# Patient Record
Sex: Male | Born: 1980 | Race: White | Hispanic: No | Marital: Married
Health system: Southern US, Community
[De-identification: ages and names within clinical notes are randomized; demographics above are authoritative.]

---

## 2015-11-22 ENCOUNTER — Emergency Department (HOSPITAL_COMMUNITY): Payer: Managed Care, Other (non HMO)

## 2015-11-22 ENCOUNTER — Encounter (HOSPITAL_COMMUNITY): Payer: Self-pay | Admitting: Radiology

## 2015-11-22 ENCOUNTER — Emergency Department (HOSPITAL_COMMUNITY)
Admission: EM | Admit: 2015-11-22 | Discharge: 2015-11-23 | Disposition: A | Discharge status: Home / Self Care | Payer: Managed Care, Other (non HMO) | Attending: Emergency Medicine | Admitting: Emergency Medicine

## 2015-11-22 DIAGNOSIS — M25552 Pain in left hip: Secondary | ICD-10-CM | POA: Insufficient documentation

## 2015-11-22 DIAGNOSIS — M21372 Foot drop, left foot: Secondary | ICD-10-CM

## 2015-11-22 DIAGNOSIS — S79922A Unspecified injury of left thigh, initial encounter: Secondary | ICD-10-CM | POA: Insufficient documentation

## 2015-11-22 DIAGNOSIS — R918 Other nonspecific abnormal finding of lung field: Secondary | ICD-10-CM | POA: Insufficient documentation

## 2015-11-22 DIAGNOSIS — R930 Abnormal findings on diagnostic imaging of skull and head, not elsewhere classified: Secondary | ICD-10-CM | POA: Diagnosis not present

## 2015-11-22 DIAGNOSIS — R791 Abnormal coagulation profile: Secondary | ICD-10-CM | POA: Insufficient documentation

## 2015-11-22 DIAGNOSIS — Y939 Activity, unspecified: Secondary | ICD-10-CM | POA: Diagnosis not present

## 2015-11-22 DIAGNOSIS — R52 Pain, unspecified: Secondary | ICD-10-CM

## 2015-11-22 DIAGNOSIS — T1490XA Injury, unspecified, initial encounter: Secondary | ICD-10-CM

## 2015-11-22 DIAGNOSIS — Y92410 Unspecified street and highway as the place of occurrence of the external cause: Secondary | ICD-10-CM | POA: Diagnosis not present

## 2015-11-22 DIAGNOSIS — T07XXXA Unspecified multiple injuries, initial encounter: Secondary | ICD-10-CM

## 2015-11-22 DIAGNOSIS — Y999 Unspecified external cause status: Secondary | ICD-10-CM | POA: Diagnosis not present

## 2015-11-22 LAB — COMPREHENSIVE METABOLIC PANEL
ALK PHOS: 50 U/L (ref 38–126)
ALT: 29 U/L (ref 17–63)
AST: 26 U/L (ref 15–41)
Albumin: 4.4 g/dL (ref 3.5–5.0)
Anion gap: 9 (ref 5–15)
BUN: 14 mg/dL (ref 6–20)
CALCIUM: 8.9 mg/dL (ref 8.9–10.3)
CHLORIDE: 107 mmol/L (ref 101–111)
CO2: 22 mmol/L (ref 22–32)
CREATININE: 1.33 mg/dL — AB (ref 0.61–1.24)
Glucose, Bld: 97 mg/dL (ref 65–99)
Potassium: 3.8 mmol/L (ref 3.5–5.1)
Sodium: 138 mmol/L (ref 135–145)
Total Bilirubin: 0.5 mg/dL (ref 0.3–1.2)
Total Protein: 6.6 g/dL (ref 6.5–8.1)

## 2015-11-22 LAB — I-STAT CHEM 8, ED
BUN: 16 mg/dL (ref 6–20)
CHLORIDE: 106 mmol/L (ref 101–111)
CREATININE: 1.5 mg/dL — AB (ref 0.61–1.24)
Calcium, Ion: 1.08 mmol/L — ABNORMAL LOW (ref 1.15–1.40)
GLUCOSE: 92 mg/dL (ref 65–99)
HEMATOCRIT: 43 % (ref 39.0–52.0)
Hemoglobin: 14.6 g/dL (ref 13.0–17.0)
POTASSIUM: 3.6 mmol/L (ref 3.5–5.1)
Sodium: 140 mmol/L (ref 135–145)
TCO2: 23 mmol/L (ref 0–100)

## 2015-11-22 LAB — PROTIME-INR
INR: 1.03
Prothrombin Time: 13.6 seconds (ref 11.4–15.2)

## 2015-11-22 LAB — SAMPLE TO BLOOD BANK

## 2015-11-22 LAB — CBC
HCT: 43.3 % (ref 39.0–52.0)
Hemoglobin: 15 g/dL (ref 13.0–17.0)
MCH: 29.8 pg (ref 26.0–34.0)
MCHC: 34.6 g/dL (ref 30.0–36.0)
MCV: 86.1 fL (ref 78.0–100.0)
PLATELETS: 340 10*3/uL (ref 150–400)
RBC: 5.03 MIL/uL (ref 4.22–5.81)
RDW: 12.1 % (ref 11.5–15.5)
WBC: 10.9 10*3/uL — AB (ref 4.0–10.5)

## 2015-11-22 LAB — I-STAT CG4 LACTIC ACID, ED: Lactic Acid, Venous: 1.11 mmol/L (ref 0.5–1.9)

## 2015-11-22 LAB — CDS SEROLOGY

## 2015-11-22 LAB — ETHANOL

## 2015-11-22 MED ORDER — FENTANYL CITRATE (PF) 100 MCG/2ML IJ SOLN
50.0000 ug | Freq: Once | INTRAMUSCULAR | Status: AC
  Administered 2015-11-22: 50 ug via INTRAVENOUS

## 2015-11-22 MED ORDER — IOPAMIDOL (ISOVUE-300) INJECTION 61%
100.0000 mL | Freq: Once | INTRAVENOUS | Status: AC | PRN
  Administered 2015-11-22: 100 mL via INTRAVENOUS

## 2015-11-22 MED ORDER — SODIUM CHLORIDE 0.9 % IV BOLUS (SEPSIS)
500.0000 mL | Freq: Once | INTRAVENOUS | Status: AC
  Administered 2015-11-22: 500 mL via INTRAVENOUS

## 2015-11-22 MED ORDER — FENTANYL CITRATE (PF) 100 MCG/2ML IJ SOLN
INTRAMUSCULAR | Status: AC
  Administered 2015-11-22: 50 ug via INTRAVENOUS
  Filled 2015-11-22: qty 2

## 2015-11-22 MED ORDER — SODIUM CHLORIDE 0.9 % IV BOLUS (SEPSIS)
1000.0000 mL | Freq: Once | INTRAVENOUS | Status: AC
  Administered 2015-11-22: 1000 mL via INTRAVENOUS

## 2015-11-22 NOTE — ED Notes (Signed)
Family updated as to patient's status.

## 2015-11-22 NOTE — ED Notes (Signed)
Transported to [facility]
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
## 2015-11-22 NOTE — Progress Notes (Signed)
   Chaplain responded to page  Facilitated family gathering w/ patient.   Will follow, as needed.  Rev. Chaplain Kipp Broodnthony Zalika Tieszen MDiv ThM

## 2015-11-22 NOTE — ED Notes (Signed)
Patient transported to CT 

## 2015-11-23 ENCOUNTER — Emergency Department (HOSPITAL_COMMUNITY): Payer: Managed Care, Other (non HMO)

## 2015-11-23 DIAGNOSIS — S79922A Unspecified injury of left thigh, initial encounter: Secondary | ICD-10-CM | POA: Diagnosis not present

## 2015-11-23 LAB — URINALYSIS, ROUTINE W REFLEX MICROSCOPIC
BILIRUBIN URINE: NEGATIVE
GLUCOSE, UA: NEGATIVE mg/dL
Hgb urine dipstick: NEGATIVE
KETONES UR: NEGATIVE mg/dL
LEUKOCYTES UA: NEGATIVE
Nitrite: NEGATIVE
PH: 6 (ref 5.0–8.0)
PROTEIN: NEGATIVE mg/dL
Specific Gravity, Urine: 1.01 (ref 1.005–1.030)

## 2015-11-23 MED ORDER — LORAZEPAM 2 MG/ML IJ SOLN
1.0000 mg | Freq: Once | INTRAMUSCULAR | Status: AC
  Administered 2015-11-23: 1 mg via INTRAVENOUS
  Filled 2015-11-23: qty 1

## 2015-11-23 MED ORDER — OXYCODONE-ACETAMINOPHEN 5-325 MG PO TABS
2.0000 | ORAL_TABLET | Freq: Once | ORAL | Status: AC
  Administered 2015-11-23: 2 via ORAL
  Filled 2015-11-23: qty 2

## 2015-11-23 MED ORDER — LORAZEPAM 2 MG/ML IJ SOLN: 2.0000 mg | Freq: Once | INTRAMUSCULAR | Status: DC

## 2015-11-23 MED ORDER — OXYCODONE-ACETAMINOPHEN 5-325 MG PO TABS: 1.0000 | ORAL_TABLET | Freq: Four times a day (QID) | ORAL | 0 refills | Status: AC | PRN

## 2015-11-23 NOTE — ED Notes (Signed)
Abrasions to left forearm, left upper arm, left knee, left knuckles cleaned and Neosporin applied
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
## 2015-11-23 NOTE — ED Notes (Signed)
Patient unable to continue MRI - requesting something for anxiety
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
## 2015-11-23 NOTE — Progress Notes (Signed)
Orthopedic Tech Progress Note Patient Details:  Vonzella NippleDavid Vandergrift 1980/07/28 [id number]  Ortho Devices Type of Ortho Device: CAM walker Ortho Device/Splint Location: lle Ortho Device/Splint Interventions: Ordered, Application   Trinna PostMartinez, Deryn Massengale J 11/23/2015, 3:52 AM

## 2015-12-02 ENCOUNTER — Ambulatory Visit: Payer: Managed Care, Other (non HMO) | Attending: Orthopedic Surgery | Admitting: Physical Therapy

## 2015-12-02 DIAGNOSIS — M546 Pain in thoracic spine: Secondary | ICD-10-CM | POA: Insufficient documentation

## 2015-12-02 DIAGNOSIS — M25552 Pain in left hip: Secondary | ICD-10-CM | POA: Diagnosis present

## 2015-12-02 DIAGNOSIS — M25562 Pain in left knee: Secondary | ICD-10-CM | POA: Diagnosis not present
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
## 2015-12-02 NOTE — Patient Instructions (Signed)
Hamstring Step 2    Left foot relaxed, knee straight, other leg bent, foot flat. Raise straight leg further upward to maximal range. Hold _30__ seconds. Relax leg completely down. Repeat _2-3__ times.  Repeat with other leg.  Perform 2-3 sessions per day.  Copyright  VHI. All rights reserved.    Piriformis Stretch    Lying on back, pull right knee toward opposite shoulder. Hold __30__ seconds. Repeat __2-3__ times. Do __2-3__ sessions per day.  Repeat with other leg.  http://gt2.exer.us/257   Copyright  VHI. All rights reserved.   Lower Trunk Rotation Stretch    Keeping back flat and feet together, rotate knees to left side. Hold __30__ seconds.  Repeat to other side. Repeat __2-3__ times per set. Do _1___ sets per session. Do ___2-3_ sessions per day.  http://orth.exer.us/122   Copyright  VHI. All rights reserved.

## 2015-12-04 ENCOUNTER — Ambulatory Visit: Payer: Managed Care, Other (non HMO)

## 2015-12-04 DIAGNOSIS — M25562 Pain in left knee: Secondary | ICD-10-CM

## 2015-12-04 DIAGNOSIS — M25552 Pain in left hip: Secondary | ICD-10-CM

## 2015-12-04 DIAGNOSIS — M546 Pain in thoracic spine: Secondary | ICD-10-CM

## 2015-12-04 NOTE — Therapy (Addendum)
[facility] [place] [address]  [address] [place], [place], [address] Phone: [phone]   Fax:  [phone]  Physical Therapy Treatment  Patient Details  Name: Charles Hogan MRN: [id number] Date of Birth: Aug 31, 1980 Referring Provider: Dr. Samson FredericBrian Swinteck  Encounter Date: 12/04/2015      PT End of Session - 12/04/15 1114    Visit Number 2   Number of Visits 8   Date for PT Re-Evaluation 12/30/15   PT Start Time 1102   PT Stop Time 1145   PT Time Calculation (min) 43 min   Activity Tolerance Patient tolerated treatment well   Behavior During Therapy [facility] for tasks assessed/performed      No past medical history on file.  No past surgical history on file.  There were no vitals filed for this visit.      Subjective Assessment - 12/04/15 1113    Subjective Pt. reporting performing HEP daily without issue.     Patient Stated Goals decrease knee pain   Currently in Pain? Yes   Pain Score 0-No pain   Multiple Pain Sites No       Today's treatment:  Therex: NuStep: level 3, 5 min; reduced to lvl 1 after 2 min due to pt. report of L hip/knee pain  HEP review: L HS stretch with strap x 30 sec; pt. requiring verbal cues for strap placement to avoid excessive stretch on calf   Modified piriformis x 30 sec on each side; pt. Requiring verbal cues to   Hooklying LTR x 30 sec each   Manual:  TPR to R UT; pt. quickly reported burning in R UT and had poor tolerance for this STM to R UT   Therex: Standing scapular retraction with blue TB  Holding onto treadmill:         Standing L hip abduction 2# 2 x 10 reps         Standing L hip flexion 2# x 15 reps           PT Education - 12/04/15 1212    Education provided Yes   Education Details standing scapular retraction, standing scapular retraction/extension with blue and green TB issued to pt.    Person(s) Educated Patient   Methods Explanation;Demonstration;Handout    Comprehension Verbalized understanding;Returned demonstration;Need further instruction             PT Long Term Goals - 12/04/15 1211      PT LONG TERM GOAL #1   Title independent with HEP (12/30/15)   Time 4   Period Weeks   Status On-going     PT LONG TERM GOAL #2   Title negotiate stairs without increase in pain for improved mobility (12/30/15)   Time 4   Period Weeks   Status On-going     PT LONG TERM GOAL #3   Title demonstrate 5/5 strength without increase in pain for improved strength and function (12/30/15)   Time 4   Period Weeks   Status On-going               Plan - 12/04/15 1114    Clinical Impression Statement Pt. pain free initially today however with L knee and hip pain following 2 min warmup on NuStep with moderate resistance.  Pt. L knee/hip pain returned throughout treatment today briefly following fatiguing activities and quickly self-resolved.  Pt. with limited tolerance for hip/knee strengthening activity today thus today's treatment focused on STM/TPR to mid/upper back  with scapular strengthening activity issued to pt. for home program.  Will plan to monitor pt. tolerance with HEP and work on LE strengthening/stretching per pt. tolerance.     PT Treatment/Interventions ADLs/Self Care Home Management;Cryotherapy;Electrical Stimulation;Moist Heat;Ultrasound;Iontophoresis 4mg /ml Dexamethasone;Neuromuscular re-education;Therapeutic exercise;Therapeutic activities;Functional mobility training;Stair training;Patient/family education;Manual techniques;Taping   PT Next Visit Plan Strengthening for areas of weakness, modalities PRN for pain      Patient will benefit from skilled therapeutic intervention in order to improve the following deficits and impairments:  Pain, Decreased strength, Impaired flexibility, Postural dysfunction  Visit Diagnosis: Acute pain of left knee  Pain in left hip  Pain in thoracic spine     Problem List There are no  active problems to display for this patient.   Kermit Balo, PTA 12/04/2015, 12:48 PM  [facility] [address]  [address] [place], [place], [address] Phone: [phone]   Fax:  [phone]  Name: Charles Hogan MRN: [id number] Date of Birth: 1981-01-20

## 2015-12-08 ENCOUNTER — Ambulatory Visit: Payer: Managed Care, Other (non HMO)

## 2015-12-08 DIAGNOSIS — M25562 Pain in left knee: Secondary | ICD-10-CM | POA: Diagnosis not present

## 2015-12-08 DIAGNOSIS — M546 Pain in thoracic spine: Secondary | ICD-10-CM

## 2015-12-08 DIAGNOSIS — M25552 Pain in left hip: Secondary | ICD-10-CM

## 2015-12-08 NOTE — Therapy (Signed)
[facility] Outpatient Rehabilitation [facility] [address]  [address] [place], [place], [address] Phone: [phone]   Fax:  [phone]  Physical Therapy Treatment  Patient Details  Name: Charles Hogan MRN: [id number] Date of Birth: September 21, 1980 Referring Provider: Dr. Samson Frederic  Encounter Date: 12/08/2015      PT End of Session - 12/08/15 1624    Visit Number 3   Number of Visits 8   Date for PT Re-Evaluation 12/30/15   PT Start Time 1620  pt. arrived 5 min late    PT Stop Time 1700   PT Time Calculation (min) 40 min   Activity Tolerance Patient tolerated treatment well   Behavior During Therapy [facility] for tasks assessed/performed      No past medical history on file.  No past surgical history on file.  There were no vitals filed for this visit.      Subjective Assessment - 12/08/15 1704    Subjective Pt. reporting he was on his feet a lot today and the L knee has been bothering him.     Patient Stated Goals decrease knee pain   Currently in Pain? Yes   Pain Score 2    Pain Location Knee   Pain Orientation Left   Pain Descriptors / Indicators Pressure;Burning   Pain Onset More than a month ago   Pain Frequency Intermittent   Multiple Pain Sites No      Today's treatment:  Therex: Recumbent bike: level 1, 5 min  L HS, glute stretch x 30 sec each way4 L ITB stretch with strap x 30 sec  HS curl + bridge with heels on peanut p-ball x 10 reps  Standing B hip hip abduction, adduction, flexion, extension with green TB x 10 reps each way; burning L knee pain reported near end of L SLS portion BATCA HS curl machine 35# x 15 reps; B concentric/L eccentric  L dorsiflexion rocker stretch x 1 min  Standing scapular retraction with black TB x 15 reps Standing scapular retraction/ext. With blue TB x 7 reps; terminated due to R UT area pain reported Standing L ITB stretch x 30 sec; pt. reporting 3/10 L knee pain with this            PT  Education - 12/08/15 1716    Education provided Yes   Education Details supine ITB stretch with strap    Person(s) Educated Patient   Methods Explanation;Demonstration;Handout   Comprehension Verbalized understanding;Returned demonstration;Need further instruction             PT Long Term Goals - 12/04/15 1211      PT LONG TERM GOAL #1   Title independent with HEP (12/30/15)   Time 4   Period Weeks   Status On-going     PT LONG TERM GOAL #2   Title negotiate stairs without increase in pain for improved mobility (12/30/15)   Time 4   Period Weeks   Status On-going     PT LONG TERM GOAL #3   Title demonstrate 5/5 strength without increase in pain for improved strength and function (12/30/15)   Time 4   Period Weeks   Status On-going               Plan - 12/08/15 1625    Clinical Impression Statement Pt. with improved tolerance for L LE strengthening activity today.  4-way hip SLR with green TB, HS curl machine, and bridge/HS curl on p-ball introduced today and  tolerated well.  Pt. L knee pain remained at a <2/10 with therex today.  Pt. still with complaint of R UT sharp pain with scap. retraction strengthening activity today.  Will continue hip/LE strengthening activities per pt. tolerance.     PT Treatment/Interventions ADLs/Self Care Home Management;Cryotherapy;Electrical Stimulation;Moist Heat;Ultrasound;Iontophoresis 4mg /ml Dexamethasone;Neuromuscular re-education;Therapeutic exercise;Therapeutic activities;Functional mobility training;Stair training;Patient/family education;Manual techniques;Taping   PT Next Visit Plan Strengthening for areas of weakness, modalities PRN for pain      Patient will benefit from skilled therapeutic intervention in order to improve the following deficits and impairments:  Pain, Decreased strength, Impaired flexibility, Postural dysfunction  Visit Diagnosis: Acute pain of left knee  Pain in left hip  Pain in thoracic  spine     Problem List There are no active problems to display for this patient.   Kermit BaloMicah Makenlee Mckeag, PTA 12/08/15 6:35 PM   [facility] [place] [address]  [address] [place], [place], [address] Phone: [phone]   Fax:  [phone]  Name: Vonzella NippleDavid Gaona MRN: [id number] Date of Birth: 1980-07-05

## 2015-12-11 ENCOUNTER — Ambulatory Visit: Payer: Managed Care, Other (non HMO)

## 2015-12-11 DIAGNOSIS — M25552 Pain in left hip: Secondary | ICD-10-CM

## 2015-12-11 DIAGNOSIS — M546 Pain in thoracic spine: Secondary | ICD-10-CM

## 2015-12-11 DIAGNOSIS — M25562 Pain in left knee: Secondary | ICD-10-CM | POA: Diagnosis not present

## 2015-12-11 NOTE — Therapy (Signed)
[facility] [place] [address]  [address] [place], [place], [address] Phone: [phone]   Fax:  [phone]  Physical Therapy Treatment  Patient Details  Name: Charles Hogan MRN: [id number] Date of Birth: 12-01-1980 Referring Provider: Dr. Samson FredericBrian Swinteck  Encounter Date: 12/11/2015      PT End of Session - 12/11/15 1623    Visit Number 4   Number of Visits 8   Date for PT Re-Evaluation 12/30/15   PT Start Time 1617   PT Stop Time 1700  Therapy session ended at 1700 sharp due to pt. scheduling conflicts.   PT Time Calculation (min) 43 min   Activity Tolerance Patient tolerated treatment well   Behavior During Therapy [facility] for tasks assessed/performed      No past medical history on file.  No past surgical history on file.  There were no vitals filed for this visit.      Subjective Assessment - 12/11/15 1620    Subjective Pt. reporting he is pulling 16 hours shifts now at work and his L knee is 5/10 pain   Patient Stated Goals decrease knee pain   Currently in Pain? No/denies   Pain Score 0-No pain   Multiple Pain Sites No        Today's treatment:  Therex: Recumbent bike: level 1, 5 min   Manual: L HS, glute, piriformis stretch x 30 sec each way L ITB stretch with strap x 30 sec   Therex: B sidelying clam shell with black TB x 10 reps  HS curl + bridge with heels on peanut p-ball x 10 reps  BATCA low row 45# x 15  BATCA HS curl machine 35# x 15 reps; B concentric/L eccentric   Modalities: Vasoneumatic Device to L knee: L LE elevated, medium compression, coldest temp., 10'         PT Long Term Goals - 12/04/15 1211      PT LONG TERM GOAL #1   Title independent with HEP (12/30/15)   Time 4   Period Weeks   Status On-going     PT LONG TERM GOAL #2   Title negotiate stairs without increase in pain for improved mobility (12/30/15)   Time 4   Period Weeks   Status On-going     PT LONG TERM  GOAL #3   Title demonstrate 5/5 strength without increase in pain for improved strength and function (12/30/15)   Time 4   Period Weeks   Status On-going               Plan - 12/11/15 1634    Clinical Impression Statement Pt. with improved tolerance for LE strengthening activity today however reporting he wasn't able to perform any HEP activities due to busy work schedule.  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 chief complaint still, "burning sensation" in L knee following standing LE strengthening activity.  Will plan to continue strengthening LE per pt. tolerance.     PT Treatment/Interventions ADLs/Self Care Home Management;Cryotherapy;Electrical Stimulation;Moist Heat;Ultrasound;Iontophoresis 4mg /ml Dexamethasone;Neuromuscular re-education;Therapeutic exercise;Therapeutic activities;Functional mobility training;Stair training;Patient/family education;Manual techniques;Taping   PT Next Visit Plan Strengthening for areas of weakness, modalities PRN for pain      Patient will benefit from skilled therapeutic intervention in order to improve the following deficits and impairments:  Pain, Decreased strength, Impaired flexibility, Postural dysfunction  Visit Diagnosis: Acute pain of left knee  Pain  in left hip  Pain in thoracic spine     Problem List There are no active problems to display for this patient.   Kermit Balo, PTA 12/11/2015, 5:16 PM  [facility] [address]  [address] [place], [place], [address] Phone: [phone]   Fax:  [phone]  Name: Charles Hogan MRN: [id number] Date of Birth: [birth year]

## 2015-12-16 ENCOUNTER — Ambulatory Visit: Payer: Managed Care, Other (non HMO) | Admitting: Physical Therapy

## 2015-12-16 DIAGNOSIS — M25562 Pain in left knee: Secondary | ICD-10-CM

## 2015-12-16 DIAGNOSIS — M25552 Pain in left hip: Secondary | ICD-10-CM

## 2015-12-16 DIAGNOSIS — M546 Pain in thoracic spine: Secondary | ICD-10-CM

## 2015-12-16 NOTE — Therapy (Signed)
[employer] [facility] [address]  [address] [place], [place], [address] Phone: [phone]   Fax:  [phone]  Physical Therapy Treatment  Patient Details  Name: Edilson Vital MRN: [id number] Date of Birth: [birth year] Referring Provider: Dr. Samson Frederic  Encounter Date: 12/16/2015      PT End of Session - 12/16/15 1054    Visit Number 5   Number of Visits 8   Date for PT Re-Evaluation 12/30/15   PT Start Time 1012   PT Stop Time 1057   PT Time Calculation (min) 45 min   Activity Tolerance Patient tolerated treatment well   Behavior During Therapy [facility] for tasks assessed/performed      No past medical history on file.  No past surgical history on file.  There were no vitals filed for this visit.      Subjective Assessment - 12/16/15 1014    Subjective done working 16 hour days [employer]); LLE hurts from working   Patient Stated Goals decrease knee pain   Currently in Pain? Yes   Pain Score 4    Pain Location Knee   Pain Orientation Left   Pain Descriptors / Indicators Pressure;Burning   Pain Type --  subacute   Pain Onset More than a month ago   Pain Frequency Intermittent   Aggravating Factors  lying down (all positions)   Pain Relieving Factors repositioning and walking around                         [facility] Adult PT Treatment/Exercise - 12/16/15 1018      Knee/Hip Exercises: Stretches   Passive Hamstring Stretch Both;3 reps;30 seconds   Passive Hamstring Stretch Limitations supine with strap   Quad Stretch Both;3 reps;30 seconds   Quad Stretch Limitations prone with strap   Piriformis Stretch 3 reps;30 seconds   Piriformis Stretch Limitations for HEP instruction     Knee/Hip Exercises: Aerobic   Nustep L4 x 6 min (decreased from L 6 after 2 min)     Knee/Hip Exercises: Standing   Functional Squat 15 reps;2 sets   Functional Squat Limitations TRX 2nd set; min cues for  technique   Lunge Walking - Round Trips reverse lunge 2x15 (x15 with each foot forward)     Knee/Hip Exercises: Supine   Bridges Limitations x15   Single Leg Bridge Left;15 reps  concentric bil   Other Supine Knee/Hip Exercises bridging with hamstring curls on green physioball x15                     PT Long Term Goals - 12/04/15 1211      PT LONG TERM GOAL #1   Title independent with HEP (12/30/15)   Time 4   Period Weeks   Status On-going     PT LONG TERM GOAL #2   Title negotiate stairs without increase in pain for improved mobility (12/30/15)   Time 4   Period Weeks   Status On-going     PT LONG TERM GOAL #3   Title demonstrate 5/5 strength without increase in pain for improved strength and function (12/30/15)   Time 4   Period Weeks   Status On-going               Plan - 12/16/15 1055    Clinical Impression Statement Increased strengthening activities today with good response and decreased in knee pain at end of session.  Reports limited compliance with HEP due to busy work schedule (16 hour days) so educated on importance of trying to complete at least once per day to maximize benefit.  Will continue to benefit from PT to maximize function and decrease pain.   PT Treatment/Interventions ADLs/Self Care Home Management;Cryotherapy;Electrical Stimulation;Moist Heat;Ultrasound;Iontophoresis 4mg /ml Dexamethasone;Neuromuscular re-education;Therapeutic exercise;Therapeutic activities;Functional mobility training;Stair training;Patient/family education;Manual techniques;Taping   PT Next Visit Plan Strengthening for areas of weakness, modalities PRN for pain   Consulted and Agree with Plan of Care Patient      Patient will benefit from skilled therapeutic intervention in order to improve the following deficits and impairments:  Pain, Decreased strength, Impaired flexibility, Postural dysfunction  Visit Diagnosis: Acute pain of left knee  Pain in left  hip  Pain in thoracic spine     Problem List There are no active problems to display for this patient.      Clarita CraneStephanie F Zorawar Strollo, PT, DPT 12/16/15 10:59 AM    [facility] [address]  [address] [place], [place], [address] Phone: [phone]   Fax:  [phone]  Name: Vonzella NippleDavid Susan MRN: [id number] Date of Birth: 1980-12-10

## 2015-12-19 ENCOUNTER — Ambulatory Visit: Payer: Managed Care, Other (non HMO)

## 2015-12-19 DIAGNOSIS — M25552 Pain in left hip: Secondary | ICD-10-CM

## 2015-12-19 DIAGNOSIS — M25562 Pain in left knee: Secondary | ICD-10-CM | POA: Diagnosis not present

## 2015-12-19 DIAGNOSIS — M546 Pain in thoracic spine: Secondary | ICD-10-CM

## 2015-12-19 NOTE — Therapy (Signed)
[facility] [address]  [address] [place], [place], [address] Phone: [phone]   Fax:  [phone]  Physical Therapy Treatment  Patient Details  Name: Vonzella NippleDavid Sponsel MRN: [id number] Date of Birth: [birth year] Referring Provider: Dr. Samson FredericBrian Swinteck  Encounter Date: 12/19/2015      PT End of Session - 12/19/15 1019    Visit Number 6   Number of Visits 8   Date for PT Re-Evaluation 12/30/15   PT Start Time 1015   PT Stop Time 1105   PT Time Calculation (min) 50 min   Activity Tolerance Patient tolerated treatment well   Behavior During Therapy [facility] for tasks assessed/performed      No past medical history on file.  No past surgical history on file.  There were no vitals filed for this visit.      Subjective Assessment - 12/19/15 1016    Subjective Pt. reporting mid back still hurts upon waking in mornings and at end of work shift.     Patient Stated Goals decrease knee pain   Currently in Pain? Yes   Pain Score 3    Pain Location Knee   Pain Orientation Left   Pain Descriptors / Indicators Pressure;Burning   Pain Onset More than a month ago   Pain Frequency Intermittent   Multiple Pain Sites No       Today's treatment:  Therex (pt. Poor tolerance for LE strengthening activity today due to intermittent cramping in L HS, L hip): Recumbent bike: level 2, 5 min   Manual: L HS, glute stretch x 30 sec each way  L ITB stretch with strap x 30 sec   Therex: L sidelying clam shell with blue TB x 15 reps; 10 reps with black TB performed two treatments ago however pt. Reported feeling, "strained" following thus back to blue TB Hooklying sustained bridge with alternating hip abd/ER with blue TB x 10 reps each side  HS curl + bridge with heels on peanut p-ball x 15 reps  TRX reverse lunge x 2 reps each; terminated due to L HS cramp BATCA low row 55# x 15 reps Prone L quad stretch with strap x 30 sec  Supine L  hip flexion/quad stretch with strap (in mod thomas position) x 30 sec   Modalities: Vasoneumatic Device to L knee: L LE elevated, medium compression, coldest temp., 10'         PT Long Term Goals - 12/04/15 1211      PT LONG TERM GOAL #1   Title independent with HEP (12/30/15)   Time 4   Period Weeks   Status On-going     PT LONG TERM GOAL #2   Title negotiate stairs without increase in pain for improved mobility (12/30/15)   Time 4   Period Weeks   Status On-going     PT LONG TERM GOAL #3   Title demonstrate 5/5 strength without increase in pain for improved strength and function (12/30/15)   Time 4   Period Weeks   Status On-going               Plan - 12/19/15 1020    Clinical Impression Statement Pt. reporting he had to run while participating at work with K-9 unit yesterday and the L knee hurt worse at end of shift following this.  Pt. with very poor tolerance for squating or TRX lunge activity today.  Standing activities terminated x 2 due to HS cramp  and L hip cramping.  Pt. still reporting 3/10 "pulling" pain in L knee with even mild warmup.  Will continue to focus on strengthening areas of weakness per pt. tolerance.  Pt. with 2 more visits in current POC.     PT Treatment/Interventions ADLs/Self Care Home Management;Cryotherapy;Electrical Stimulation;Moist Heat;Ultrasound;Iontophoresis 4mg /ml Dexamethasone;Neuromuscular re-education;Therapeutic exercise;Therapeutic activities;Functional mobility training;Stair training;Patient/family education;Manual techniques;Taping   PT Next Visit Plan Strengthening for areas of weakness, modalities PRN for pain      Patient will benefit from skilled therapeutic intervention in order to improve the following deficits and impairments:  Pain, Decreased strength, Impaired flexibility, Postural dysfunction  Visit Diagnosis: Acute pain of left knee  Pain in left hip  Pain in thoracic spine     Problem List There are  no active problems to display for this patient.   Kermit Balo, PTA 12/19/15 12:18 PM  [facility] [address]  [address] [place], [place], [address] Phone: [phone]   Fax:  [phone]  Name: Advait Buice MRN: [id number] Date of Birth: Jul 12, 1980

## 2015-12-22 ENCOUNTER — Ambulatory Visit: Payer: Managed Care, Other (non HMO) | Admitting: Physical Therapy

## 2015-12-22 DIAGNOSIS — M546 Pain in thoracic spine: Secondary | ICD-10-CM

## 2015-12-22 DIAGNOSIS — M25562 Pain in left knee: Secondary | ICD-10-CM | POA: Diagnosis not present

## 2015-12-22 DIAGNOSIS — M25552 Pain in left hip: Secondary | ICD-10-CM

## 2015-12-22 NOTE — Therapy (Addendum)
[facility] [address]  [place], [place], [address] Phone: [phone]   Fax:  [phone]  Physical Therapy Treatment  Patient Details  Name: Charles Hogan MRN: [id number] Date of Birth: April 25, 1980 Referring Provider: Dr. Rod Can  Encounter Date: 12/22/2015      PT End of Session - 12/22/15 1057    Visit Number 7   Number of Visits 8   Date for PT Re-Evaluation 12/30/15   PT Start Time 6962   PT Stop Time 1056   PT Time Calculation (min) 41 min   Activity Tolerance Patient tolerated treatment well   Behavior During Therapy [facility] for tasks assessed/performed      No past medical history on file.  No past surgical history on file.  There were no vitals filed for this visit.      Subjective Assessment - 12/22/15 1017    Subjective did well over the weekend, had a little L knee pain on weekend.   Limitations Other (comment)   Patient Stated Goals decrease knee pain   Currently in Pain? Yes   Pain Score 3    Pain Location Knee   Pain Orientation Left   Pain Descriptors / Indicators Pressure;Burning   Pain Type --  subacute   Pain Onset More than a month ago   Pain Frequency Intermittent   Aggravating Factors  NuStep, stairs   Pain Relieving Factors repositioning, walking around                         [facility] Adult PT Treatment/Exercise - 12/22/15 1019      Knee/Hip Exercises: Stretches   Passive Hamstring Stretch Both;3 reps;30 seconds   Passive Hamstring Stretch Limitations supine with strap   Quad Stretch Both;3 reps;30 seconds   Quad Stretch Limitations prone with strap   Piriformis Stretch 3 reps;30 seconds     Knee/Hip Exercises: Aerobic   Nustep L6 x 6 min     Knee/Hip Exercises: Machines for Strengthening   Cybex Leg Press 35# LLE only 2x10     Knee/Hip Exercises: Standing   Step Down 2 sets;15 reps;Hand Hold: 0;Step Height: 4";Left   Step Down Limitations  increased burning pain in LLE after 10 reps on 2nd set   Functional Squat 15 reps;2 sets   Functional Squat Limitations TRX     Knee/Hip Exercises: Supine   Bridges Limitations x15 on green physioball   Other Supine Knee/Hip Exercises bridging with hamstring curls on green physioball x15                     PT Long Term Goals - 12/04/15 1211      PT LONG TERM GOAL #1   Title independent with HEP (12/30/15)   Time 4   Period Weeks   Status On-going     PT LONG TERM GOAL #2   Title negotiate stairs without increase in pain for improved mobility (12/30/15)   Time 4   Period Weeks   Status On-going     PT LONG TERM GOAL #3   Title demonstrate 5/5 strength without increase in pain for improved strength and function (12/30/15)   Time 4   Period Weeks   Status On-going               Plan - 12/22/15 1057    Clinical Impression Statement Pt doing well and reports some pain in L knee with  stairs but at this time has returned to full work duties and feels comfortable transitioning to HEP.  Pt has MD appt after next PT session so will hold on d/c until after seeing MD but anticipate pt will be ready to transition to HEP.   PT Treatment/Interventions ADLs/Self Care Home Management;Cryotherapy;Electrical Stimulation;Moist Heat;Ultrasound;Iontophoresis 74m/ml Dexamethasone;Neuromuscular re-education;Therapeutic exercise;Therapeutic activities;Functional mobility training;Stair training;Patient/family education;Manual techniques;Taping   PT Next Visit Plan check goals, plan for d/c but wait until after MD visit   Consulted and Agree with Plan of Care Patient      Patient will benefit from skilled therapeutic intervention in order to improve the following deficits and impairments:  Pain, Decreased strength, Impaired flexibility, Postural dysfunction  Visit Diagnosis: Acute pain of left knee  Pain in left hip  Pain in thoracic spine     Problem List There are no  active problems to display for this patient.     SLaureen Abrahams PT, DPT 12/22/15 10:59 AM    [place] [address] [place] [address]Phone: [phone]  Fax:  [phone] Name: Charles FortunatoMRN: [id number] of Birth: 2February 27, 1982     PHYSICAL THERAPY DISCHARGE SUMMARY  Visits from Start of Care: 7  Current functional level related to goals / functional outcomes: See above; pt released by MD prior to d/c visit so goals not assessed but feel goals would have been met   Remaining deficits: n/a   Education / Equipment: HEP  Plan: Patient agrees to discharge.  Patient goals were not met. Patient is being discharged due to being pleased with the current functional level.  ?????     SLaureen Abrahams PT, DPT 01/19/16 7:37 AM  [facility] [id number] [phone](office) [phone](fax)

## 2015-12-25 ENCOUNTER — Ambulatory Visit: Payer: Managed Care, Other (non HMO)
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
